# Patient Record
Sex: Male | Born: 1970
Health system: Southern US, Community
[De-identification: ages and names within clinical notes are randomized; demographics above are authoritative.]

## PROBLEM LIST (undated history)

## (undated) DIAGNOSIS — E119 Type 2 diabetes mellitus without complications: Secondary | ICD-10-CM

## (undated) DIAGNOSIS — H409 Unspecified glaucoma: Secondary | ICD-10-CM

## (undated) DIAGNOSIS — K219 Gastro-esophageal reflux disease without esophagitis: Secondary | ICD-10-CM

## (undated) DIAGNOSIS — M549 Dorsalgia, unspecified: Secondary | ICD-10-CM

## (undated) DIAGNOSIS — I10 Essential (primary) hypertension: Secondary | ICD-10-CM

## (undated) HISTORY — PX: CATARACT EXTRACTION: SUR2

## (undated) HISTORY — PX: GLAUCOMA SURGERY: SHX656

---

## 2019-05-10 ENCOUNTER — Emergency Department (HOSPITAL_BASED_OUTPATIENT_CLINIC_OR_DEPARTMENT_OTHER)
Admission: EM | Admit: 2019-05-10 | Discharge: 2019-05-10 | Disposition: A | Discharge status: Home / Self Care | Payer: Medicare HMO | Attending: Emergency Medicine | Admitting: Emergency Medicine

## 2019-05-10 ENCOUNTER — Other Ambulatory Visit: Payer: Self-pay

## 2019-05-10 ENCOUNTER — Emergency Department (HOSPITAL_BASED_OUTPATIENT_CLINIC_OR_DEPARTMENT_OTHER): Payer: Medicare HMO

## 2019-05-10 ENCOUNTER — Encounter (HOSPITAL_BASED_OUTPATIENT_CLINIC_OR_DEPARTMENT_OTHER): Payer: Self-pay

## 2019-05-10 DIAGNOSIS — E119 Type 2 diabetes mellitus without complications: Secondary | ICD-10-CM | POA: Diagnosis not present

## 2019-05-10 DIAGNOSIS — R1013 Epigastric pain: Secondary | ICD-10-CM | POA: Insufficient documentation

## 2019-05-10 DIAGNOSIS — R0789 Other chest pain: Secondary | ICD-10-CM | POA: Diagnosis not present

## 2019-05-10 DIAGNOSIS — R079 Chest pain, unspecified: Secondary | ICD-10-CM | POA: Diagnosis present

## 2019-05-10 DIAGNOSIS — Z87891 Personal history of nicotine dependence: Secondary | ICD-10-CM | POA: Insufficient documentation

## 2019-05-10 DIAGNOSIS — I10 Essential (primary) hypertension: Secondary | ICD-10-CM | POA: Diagnosis not present

## 2019-05-10 HISTORY — DX: Essential (primary) hypertension: I10

## 2019-05-10 HISTORY — DX: Unspecified glaucoma: H40.9

## 2019-05-10 HISTORY — DX: Type 2 diabetes mellitus without complications: E11.9

## 2019-05-10 HISTORY — DX: Dorsalgia, unspecified: M54.9

## 2019-05-10 HISTORY — DX: Gastro-esophageal reflux disease without esophagitis: K21.9

## 2019-05-10 LAB — CBC WITH DIFFERENTIAL/PLATELET
Abs Immature Granulocytes: 0.04 10*3/uL (ref 0.00–0.07)
Basophils Absolute: 0 10*3/uL (ref 0.0–0.1)
Basophils Relative: 1 %
Eosinophils Absolute: 0.2 10*3/uL (ref 0.0–0.5)
Eosinophils Relative: 2 %
HCT: 44.2 % (ref 39.0–52.0)
Hemoglobin: 15.2 g/dL (ref 13.0–17.0)
Immature Granulocytes: 1 %
Lymphocytes Relative: 41 %
Lymphs Abs: 3.5 10*3/uL (ref 0.7–4.0)
MCH: 30 pg (ref 26.0–34.0)
MCHC: 34.4 g/dL (ref 30.0–36.0)
MCV: 87.2 fL (ref 80.0–100.0)
Monocytes Absolute: 0.7 10*3/uL (ref 0.1–1.0)
Monocytes Relative: 8 %
Neutro Abs: 4.2 10*3/uL (ref 1.7–7.7)
Neutrophils Relative %: 47 %
Platelets: 314 10*3/uL (ref 150–400)
RBC: 5.07 MIL/uL (ref 4.22–5.81)
RDW: 12.1 % (ref 11.5–15.5)
WBC: 8.6 10*3/uL (ref 4.0–10.5)
nRBC: 0 % (ref 0.0–0.2)

## 2019-05-10 LAB — TROPONIN I (HIGH SENSITIVITY)
Troponin I (High Sensitivity): 6 ng/L (ref <18)
Troponin I (High Sensitivity): 5 ng/L (ref <18)

## 2019-05-10 LAB — URINALYSIS, ROUTINE W REFLEX MICROSCOPIC
Bilirubin Urine: NEGATIVE
Glucose, UA: >=500 mg/dL — AB
Hgb urine dipstick: NEGATIVE
Ketones, ur: NEGATIVE mg/dL
Leukocytes,Ua: NEGATIVE
Nitrite: NEGATIVE
Protein, ur: NEGATIVE mg/dL
Specific Gravity, Urine: 1.02 (ref 1.005–1.030)
pH: 6 (ref 5.0–8.0)

## 2019-05-10 LAB — URINALYSIS, MICROSCOPIC (REFLEX)

## 2019-05-10 LAB — COMPREHENSIVE METABOLIC PANEL
ALT: 46 U/L — ABNORMAL HIGH (ref 0–44)
AST: 48 U/L — ABNORMAL HIGH (ref 15–41)
Albumin: 3.5 g/dL (ref 3.5–5.0)
Alkaline Phosphatase: 46 U/L (ref 38–126)
Anion gap: 10 (ref 5–15)
BUN: 16 mg/dL (ref 6–20)
CO2: 24 mmol/L (ref 22–32)
Calcium: 8.9 mg/dL (ref 8.9–10.3)
Chloride: 99 mmol/L (ref 98–111)
Creatinine, Ser: 1.22 mg/dL (ref 0.61–1.24)
GFR calc Af Amer: >60 mL/min (ref >60)
GFR calc non Af Amer: >60 mL/min (ref >60)
Glucose, Bld: 152 mg/dL — ABNORMAL HIGH (ref 70–99)
Potassium: 3.2 mmol/L — ABNORMAL LOW (ref 3.5–5.1)
Sodium: 133 mmol/L — ABNORMAL LOW (ref 135–145)
Total Bilirubin: 1.2 mg/dL (ref 0.3–1.2)
Total Protein: 7.2 g/dL (ref 6.5–8.1)

## 2019-05-10 LAB — LIPASE, BLOOD: Lipase: 37 U/L (ref 11–51)

## 2019-05-10 MED ORDER — PANTOPRAZOLE SODIUM 40 MG IV SOLR
40.0000 mg | Freq: Once | INTRAVENOUS | Status: AC
  Administered 2019-05-10: 40 mg via INTRAVENOUS
  Filled 2019-05-10: qty 40

## 2019-05-10 MED ORDER — LIDOCAINE VISCOUS HCL 2 % MT SOLN
15.0000 mL | Freq: Once | OROMUCOSAL | Status: DC
  Filled 2019-05-10: qty 15

## 2019-05-10 MED ORDER — OMEPRAZOLE 20 MG PO CPDR: 20.0000 mg | DELAYED_RELEASE_CAPSULE | Freq: Two times a day (BID) | ORAL | 0 refills | Status: AC

## 2019-05-10 MED ORDER — ALUM & MAG HYDROXIDE-SIMETH 200-200-20 MG/5ML PO SUSP
30.0000 mL | Freq: Once | ORAL | Status: DC
  Filled 2019-05-10: qty 30

## 2019-05-10 MED ORDER — POTASSIUM CHLORIDE CRYS ER 20 MEQ PO TBCR
40.0000 meq | EXTENDED_RELEASE_TABLET | Freq: Once | ORAL | Status: AC
  Administered 2019-05-10: 40 meq via ORAL
  Filled 2019-05-10: qty 2

## 2019-05-10 NOTE — Discharge Instructions (Addendum)
Your laboratory results were within normal limits today.  I have prescribed a short course of medication to help with your likely reflux, this can also be purchased over the counter with the generic brand.   Follow up with your primary care physician in 1 week for improvement.

## 2019-05-10 NOTE — ED Triage Notes (Addendum)
Pt c/o CP x 3 days-denies at present-also c/o abd pain that is worse after eating-NAD-steady gait

## 2019-05-10 NOTE — ED Provider Notes (Signed)
[facility] EMERGENCY DEPARTMENT Provider Note   CSN: [id number] Arrival date & time: 05/10/19  1425     History Chief Complaint  Patient presents with  . Chest Pain    Andre Guerrero is a 49 y.o. male.  49 y.o male with a PMH of DM, GERD, HTN presents to the ED with a chief complaint of epigastric/chest pain x a couple of days ago. Symptoms began after eating at [employer], a [employer]. Reports a burning epigastric sensation with radiation to his right arm. He does have a prior history of reflux, which he was on omeprazole for but discontinued taking. The pain is worse after eating and better with rest. However, he had the burning sensation this morning upon waking up, reports he was "sweaty" when this occurred. He has not taken any medication for improvement in his symptoms. No fever, no nausea, no sob, no vomiting. No prior history of MI, CAD or prior blood clots. No family history of MI.   The history is provided by the patient.  Chest Pain Pain location:  Epigastric Pain quality: burning   Pain radiates to:  R arm Pain severity:  Mild Duration:  3 days Chronicity:  New Ineffective treatments:  None tried Associated symptoms: heartburn   Associated symptoms: no back pain, no fever, no nausea, no numbness, no shortness of breath and no vomiting        Past Medical History:  Diagnosis Date  . Back pain   . Diabetes mellitus without complication ([place])   . GERD (gastroesophageal reflux disease)   . Glaucoma   . Hypertension     There are no problems to display for this patient.   Past Surgical History:  Procedure Laterality Date  . CATARACT EXTRACTION    . GLAUCOMA SURGERY         No family history on file.  Social History   Tobacco Use  . Smoking status: Former Research scientist (life sciences)  . Smokeless tobacco: Never Used  Substance Use Topics  . Alcohol use: Never  . Drug use: Never    Home Medications Prior to Admission medications   Medication Sig  Start Date End Date Taking? Authorizing Provider  omeprazole (PRILOSEC) 20 MG capsule Take 1 capsule (20 mg total) by mouth 2 (two) times daily before a meal for 14 days. 05/10/19 05/24/19  Janeece Fitting, PA-C    Allergies    Patient has no known allergies.  Review of Systems   Review of Systems  Constitutional: Negative for fever.  HENT: Negative for sinus pressure and sore throat.   Respiratory: Negative for shortness of breath.   Cardiovascular: Positive for chest pain.  Gastrointestinal: Positive for heartburn. Negative for nausea and vomiting.  Genitourinary: Negative for flank pain.  Musculoskeletal: Negative for back pain.  Neurological: Negative for numbness.  All other systems reviewed and are negative.   Physical Exam Updated Vital Signs BP 109/68 (BP Location: Right Arm)   Pulse 79   Temp 98.6 F (37 C) (Oral)   Resp 20   Ht 6' (1.829 m)   Wt 129.7 kg   SpO2 96%   BMI 38.79 kg/m   Physical Exam Vitals and nursing note reviewed.  Constitutional:      Appearance: He is well-developed.  HENT:     Head: Normocephalic and atraumatic.  Cardiovascular:     Rate and Rhythm: Normal rate.     Heart sounds: No murmur.     Comments: No bilateral pitting edema, no calf tenderness.  Pulmonary:     Effort: Pulmonary effort is normal. No tachypnea.     Breath sounds: Normal breath sounds. No decreased breath sounds, wheezing or rhonchi.     Comments: No wheezing, rhonchi, rales. Abdominal:     General: Bowel sounds are normal.     Palpations: Abdomen is soft.     Tenderness: There is abdominal tenderness in the epigastric area. There is no right CVA tenderness or left CVA tenderness.     Hernia: No hernia is present.     Comments: Soft, nontender to palpation, bowel sounds are normal.  Neurological:     Mental Status: He is alert.     ED Results / Procedures / Treatments   Labs (all labs ordered are listed, but only abnormal results are displayed) Labs Reviewed    COMPREHENSIVE METABOLIC PANEL - Abnormal; Notable for the following components:      Result Value   Sodium 133 (*)    Potassium 3.2 (*)    Glucose, Bld 152 (*)    AST 48 (*)    ALT 46 (*)    All other components within normal limits  URINALYSIS, ROUTINE W REFLEX MICROSCOPIC - Abnormal; Notable for the following components:   Glucose, UA >=500 (*)    All other components within normal limits  URINALYSIS, MICROSCOPIC (REFLEX) - Abnormal; Notable for the following components:   Bacteria, UA RARE (*)    All other components within normal limits  CBC WITH DIFFERENTIAL/PLATELET  LIPASE, BLOOD  TROPONIN I (HIGH SENSITIVITY)  TROPONIN I (HIGH SENSITIVITY)    EKG None  Radiology DG Chest Portable 1 View  Result Date: 05/10/2019 CLINICAL DATA:  Pt c/o CP x 3 days--also c/o abd pain that is worse after eating-NAD- PER ED Notes. HX: HTN, Diabetes, former smoker EXAM: PORTABLE CHEST 1 VIEW COMPARISON:  05/02/2018 FINDINGS: Cardiac silhouette normal in size. Normal mediastinal and hilar contours. Mild stable linear atelectasis at the right lung base. Lungs otherwise clear. Eventrated posterior right hemidiaphragm, stable. No pleural effusion or pneumothorax. Skeletal structures are grossly intact. IMPRESSION: No active disease. Electronically Signed   By: Amie Portland M.D.   On: 05/10/2019 15:39    Procedures Procedures (including critical care time)  Medications Ordered in ED Medications  alum & mag hydroxide-simeth (MAALOX/MYLANTA) 200-200-20 MG/5ML suspension 30 mL (30 mLs Oral Refused 05/10/19 1526)    And  lidocaine (XYLOCAINE) 2 % viscous mouth solution 15 mL (15 mLs Oral Refused 05/10/19 1526)  pantoprazole (PROTONIX) injection 40 mg (40 mg Intravenous Given 05/10/19 1540)  potassium chloride SA (KLOR-CON) CR tablet 40 mEq (40 mEq Oral Given 05/10/19 1652)    ED Course  I have reviewed the triage vital signs and the nursing notes.  Pertinent labs & imaging results that were  available during my care of the patient were reviewed by me and considered in my medical decision making (see chart for details).  Clinical Course as of May 10 1751  Wed May 10, 2019  1649 Two episodes of non bloody diarrhea yesterday, given oral k+ for replacement.   Potassium(!): 3.2 [JS]  1752 Remains flat  Troponin I (High Sensitivity): 5 [JS]    Clinical Course User Index [JS] Claude Manges, PA-C   MDM Rules/Calculators/A&P    Patient with a PMH of DM, presents to the ED with chief complaint of epigastric burning pain with radiation to his chest along with the right side of her chest for the past 3 days.  Her symptoms began after  eating fried fissures, a local restaurant where he had some fried fish.  He reports an episode today where he woke up and felt the same symptoms, symptoms have been occurring after every meal.  This is relieved with not eating along with resting.  He does not have any prior history of MI, no family CAD, currently diabetic but reports compliance with his medication.  Differential diagnoses included but not limited to ACS, GERD, digestion.  He was previously on omeprazole for his reflux but discontinued this as he states "I just got better".  He has not taken any medication for improvement in his symptoms today.  I attempted to give him a GI cocktail however reports he does not like the taste, therefore this was not given.  He was given some Protonix with improvement in his symptoms.  Hypertension of his blood work revealed no leukocytosis, no signs of anemia.  CMP with some mild hyponatremia, mild hypokalemia which orally replaced for patient.  He did report to nonbloody episodes of diarrhea yesterday.  Creatinine level is within normal limits, no signs of dehydration.  LFTs are slightly elevated, suspect this is likely coming from irritation to his abdomen.  He has not had any nausea, vomiting, pain to the right upper quadrant, lower suspicion for gallbladder  pathology.  Lipase level is within normal limits.  First troponin is negative, will obtain delta.  EKG without any ST elevations, changes consistent with infarct.  UA with some rare bacteria but no nitrites or leukocytes or ketones.  Chest x-ray without any constipation, pneumothorax, pleural effusion.  He reports no fevers at home, no sick exposures.  HEART SCORE 2   Delta troponin remains flat.  Patient was advised to continue his omeprazole dosing may be up to 2 times a day before his meals.  All results were discussed with patient at length, return precautions provided.  Patient stable for discharge.  Portions of this note were generated with Scientist, clinical (histocompatibility and immunogenetics). Dictation errors may occur despite best attempts at proofreading.  Final Clinical Impression(s) / ED Diagnoses Final diagnoses:  Atypical chest pain    Rx / DC Orders ED Discharge Orders         Ordered    omeprazole (PRILOSEC) 20 MG capsule  2 times daily before meals     05/10/19 1713           Claude Manges, PA-C 05/10/19 1753    Virgina Norfolk, DO 05/10/19 1804

## 2019-09-26 ENCOUNTER — Ambulatory Visit: Payer: Self-pay

## 2019-09-26 ENCOUNTER — Ambulatory Visit: Payer: Medicare HMO | Attending: Internal Medicine

## 2019-09-26 DIAGNOSIS — Z23 Encounter for immunization: Secondary | ICD-10-CM

## 2019-09-26 NOTE — Progress Notes (Signed)
   Covid-19 Vaccination Clinic  Name:  Andre Guerrero    MRN: [id number] DOB: 1970-08-11  09/26/2019  [name] was observed post Covid-19 immunization for 15 minutes without incident. He was provided with Vaccine Information Sheet and instruction to access the V-Safe system.   [name] was instructed to call 911 with any severe reactions post vaccine: Marland Kitchen Difficulty breathing  . Swelling of face and throat  . A fast heartbeat  . A bad rash all over body  . Dizziness and weakness   Immunizations Administered    Name Date Dose VIS Date Route   Pfizer COVID-19 Vaccine 09/26/2019 12:02 PM 0.3 mL 04/12/2018 Intramuscular   Manufacturer: [employer], [employer]   Lot: [id number]   NDC: [address]-1

## 2019-10-17 ENCOUNTER — Ambulatory Visit: Payer: Medicare HMO | Attending: Internal Medicine

## 2019-10-17 DIAGNOSIS — Z23 Encounter for immunization: Secondary | ICD-10-CM

## 2019-10-17 NOTE — Progress Notes (Signed)
   Covid-19 Vaccination Clinic  Name:  Tayson Schnelle    MRN: [id number] DOB: 1970/07/31  10/17/2019  Mr. Pedro was observed post Covid-19 immunization for 15 minutes without incident. He was provided with Vaccine Information Sheet and instruction to access the V-Safe system.   Mr. Mckeehan was instructed to call 911 with any severe reactions post vaccine: Marland Kitchen Difficulty breathing  . Swelling of face and throat  . A fast heartbeat  . A bad rash all over body  . Dizziness and weakness   Immunizations Administered    Name Date Dose VIS Date Route   Pfizer COVID-19 Vaccine 10/17/2019 12:17 PM 0.3 mL 04/12/2018 Intramuscular   Manufacturer: [employer], [employer]   Lot: [id number]   NDC: [address]-2

## 2021-06-16 IMAGING — DX DG CHEST 1V PORT
1 series · 1 of 1 positions shown · non-contrast
Comparison: 05/02/2018

CLINICAL DATA: Pt c/o CP x 3 days--also c/o abd pain that is worse
after eating-NAD- PER ED Notes. HX: HTN, Diabetes, former smoker

EXAM:
PORTABLE CHEST 1 VIEW

[chest ap]
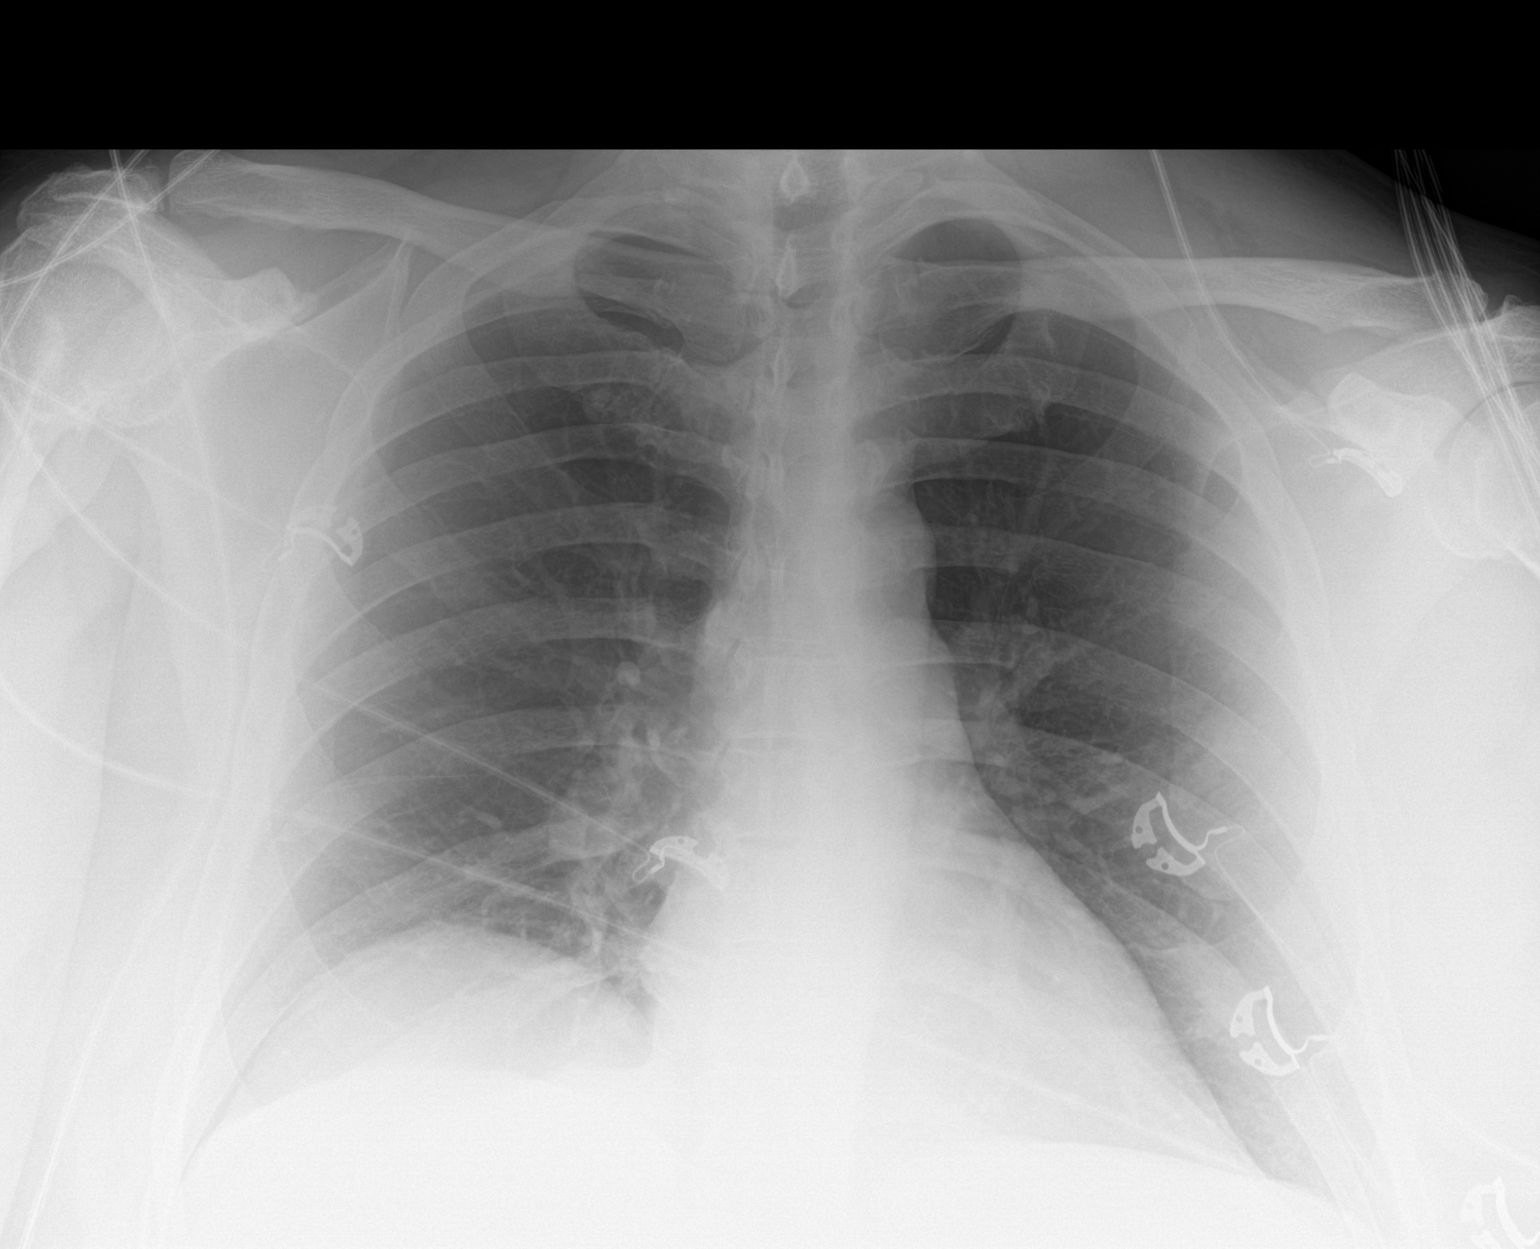

[1 of 1 positions shown; findings below may reference images not displayed]

FINDINGS: Cardiac silhouette normal in size. Normal mediastinal and hilar
contours.

Mild stable linear atelectasis at the right lung base. Lungs
otherwise clear.

Eventrated posterior right hemidiaphragm, stable. No pleural
effusion or pneumothorax.

Skeletal structures are grossly intact.
IMPRESSION: No active disease.

## 2022-04-05 ENCOUNTER — Other Ambulatory Visit: Payer: Self-pay

## 2022-04-05 ENCOUNTER — Emergency Department (HOSPITAL_BASED_OUTPATIENT_CLINIC_OR_DEPARTMENT_OTHER): Payer: 59

## 2022-04-05 ENCOUNTER — Emergency Department (HOSPITAL_BASED_OUTPATIENT_CLINIC_OR_DEPARTMENT_OTHER)
Admission: EM | Admit: 2022-04-05 | Discharge: 2022-04-06 | Disposition: A | Discharge status: Home / Self Care | Payer: 59 | Attending: Emergency Medicine | Admitting: Emergency Medicine

## 2022-04-05 ENCOUNTER — Encounter (HOSPITAL_BASED_OUTPATIENT_CLINIC_OR_DEPARTMENT_OTHER): Payer: Self-pay | Admitting: Emergency Medicine

## 2022-04-05 DIAGNOSIS — R0789 Other chest pain: Secondary | ICD-10-CM | POA: Diagnosis not present

## 2022-04-05 DIAGNOSIS — M545 Low back pain, unspecified: Secondary | ICD-10-CM | POA: Diagnosis not present

## 2022-04-05 DIAGNOSIS — R1030 Lower abdominal pain, unspecified: Secondary | ICD-10-CM | POA: Diagnosis present

## 2022-04-05 LAB — BASIC METABOLIC PANEL
Anion gap: 7 (ref 5–15)
BUN: 15 mg/dL (ref 6–20)
CO2: 25 mmol/L (ref 22–32)
Calcium: 8.7 mg/dL — ABNORMAL LOW (ref 8.9–10.3)
Chloride: 101 mmol/L (ref 98–111)
Creatinine, Ser: 1.14 mg/dL (ref 0.61–1.24)
GFR, Estimated: >60 mL/min (ref >60)
Glucose, Bld: 129 mg/dL — ABNORMAL HIGH (ref 70–99)
Potassium: 3.4 mmol/L — ABNORMAL LOW (ref 3.5–5.1)
Sodium: 133 mmol/L — ABNORMAL LOW (ref 135–145)

## 2022-04-05 LAB — URINALYSIS, ROUTINE W REFLEX MICROSCOPIC
Bilirubin Urine: NEGATIVE
Glucose, UA: >=500 mg/dL — AB
Hgb urine dipstick: NEGATIVE
Ketones, ur: NEGATIVE mg/dL
Leukocytes,Ua: NEGATIVE
Nitrite: NEGATIVE
Protein, ur: NEGATIVE mg/dL
Specific Gravity, Urine: 1.015 (ref 1.005–1.030)
pH: 6 (ref 5.0–8.0)

## 2022-04-05 LAB — CBC
HCT: 41.9 % (ref 39.0–52.0)
Hemoglobin: 14.2 g/dL (ref 13.0–17.0)
MCH: 30.3 pg (ref 26.0–34.0)
MCHC: 33.9 g/dL (ref 30.0–36.0)
MCV: 89.3 fL (ref 80.0–100.0)
Platelets: 312 10*3/uL (ref 150–400)
RBC: 4.69 MIL/uL (ref 4.22–5.81)
RDW: 12.3 % (ref 11.5–15.5)
WBC: 8.6 10*3/uL (ref 4.0–10.5)
nRBC: 0 % (ref 0.0–0.2)

## 2022-04-05 LAB — TROPONIN I (HIGH SENSITIVITY): Troponin I (High Sensitivity): 6 ng/L (ref <18)

## 2022-04-05 LAB — URINALYSIS, MICROSCOPIC (REFLEX)

## 2022-04-05 LAB — LIPASE, BLOOD: Lipase: 43 U/L (ref 11–51)

## 2022-04-05 MED ORDER — OXYCODONE-ACETAMINOPHEN 5-325 MG PO TABS
2.0000 | ORAL_TABLET | Freq: Once | ORAL | Status: AC
  Administered 2022-04-05: 2 via ORAL
  Filled 2022-04-05: qty 2

## 2022-04-05 NOTE — ED Provider Notes (Incomplete)
[facility] Provider Note   CSN: [id number] Arrival date & time: 04/05/22  2135     History {Add pertinent medical, surgical, social history, OB history to HPI:1} Chief Complaint  Patient presents with  . Flank Pain    Shayla Donnel is a 52 y.o. male.  Pt complains of right sided low back pain.  Pt reports pain in right low back radiates to right side of lower abdomen. Pt reports he also had an episode of discomfort in his chest.  Pt reports chest discomfort was brief  The history is provided by the patient. No language interpreter was used.  Flank Pain This is a new problem.       Home Medications Prior to Admission medications   Medication Sig Start Date End Date Taking? Authorizing Provider  omeprazole (PRILOSEC) 20 MG capsule Take 1 capsule (20 mg total) by mouth 2 (two) times daily before a meal for 14 days. 05/10/19 05/24/19  Janeece Fitting, PA-C      Allergies    Benadryl [diphenhydramine]    Review of Systems   Review of Systems  Genitourinary:  Positive for flank pain.  All other systems reviewed and are negative.   Physical Exam Updated Vital Signs BP (!) 152/79 (BP Location: Left Arm)   Pulse 71   Temp 98.1 F (36.7 C) (Oral)   Resp 18   SpO2 97%  Physical Exam Vitals and nursing note reviewed.  Constitutional:      Appearance: He is well-developed.  HENT:     Head: Normocephalic.     Nose: Nose normal.     Mouth/Throat:     Mouth: Mucous membranes are moist.  Cardiovascular:     Rate and Rhythm: Normal rate.  Pulmonary:     Effort: Pulmonary effort is normal.  Abdominal:     General: Abdomen is flat. There is no distension.     Palpations: Abdomen is soft.  Musculoskeletal:        General: Normal range of motion.     Cervical back: Normal range of motion.  Skin:    General: Skin is warm.  Neurological:     Mental Status: He is alert and oriented to person, place, and time.  Psychiatric:         Mood and Affect: Mood normal.     ED Results / Procedures / Treatments   Labs (all labs ordered are listed, but only abnormal results are displayed) Labs Reviewed  BASIC METABOLIC PANEL - Abnormal; Notable for the following components:      Result Value   Sodium 133 (*)    Potassium 3.4 (*)    Glucose, Bld 129 (*)    Calcium 8.7 (*)    All other components within normal limits  URINALYSIS, ROUTINE W REFLEX MICROSCOPIC - Abnormal; Notable for the following components:   Glucose, UA >=500 (*)    All other components within normal limits  URINALYSIS, MICROSCOPIC (REFLEX) - Abnormal; Notable for the following components:   Bacteria, UA RARE (*)    All other components within normal limits  CBC  LIPASE, BLOOD  TROPONIN I (HIGH SENSITIVITY)  TROPONIN I (HIGH SENSITIVITY)    EKG None  Radiology DG Chest 2 View  Result Date: 04/05/2022 CLINICAL DATA:  Right-sided chest pain radiating to back for several days EXAM: CHEST - 2 VIEW COMPARISON:  05/10/2019 FINDINGS: Frontal and lateral views of the chest demonstrate an unremarkable cardiac silhouette. No airspace disease, effusion,  or pneumothorax. There is a faint rounded 1.6 cm density overlying the right anterior third rib, which may reflect summation of shadows. Follow-up two-view chest x-ray may be useful when clinically able to exclude underlying parenchymal nodule. No acute bony abnormalities. IMPRESSION: 1. 1.6 cm rounded density projecting over the right anterior third rib. This most likely reflect superimposition of shadows, and PA and lateral chest x-ray recommended when clinical situation permits. 2. Otherwise no acute intrathoracic process. Electronically Signed   By: Randa Ngo M.D.   On: 04/05/2022 22:09    Procedures Procedures  {Document cardiac monitor, telemetry assessment procedure when appropriate:1}  Medications Ordered in ED Medications  oxyCODONE-acetaminophen (PERCOCET/ROXICET) 5-325 MG per tablet 2  tablet (2 tablets Oral Given 04/05/22 2330)    ED Course/ Medical Decision Making/ A&P   {   Click here for ABCD2, HEART and other calculatorsREFRESH Note before signing :1}                          Medical Decision Making Pt reports pain in his right lower back.  Pt reports pain radiates to right lower abdomen   Amount and/or Complexity of Data Reviewed Independent Historian: spouse    Details: Pt here with family supportive  Labs: ordered. Decision-making details documented in ED Course.    Details: Labs ordered reviewed and interpreted  Ua show glucose over 500  Radiology: ordered and independent interpretation performed. Decision-making details documented in ED Course.    Details: Ct renal  no stone.  No acute abnormality   ECG/medicine tests: ordered and independent interpretation performed. Decision-making details documented in ED Course.  Risk Prescription drug management.   ***  {Document critical care time when appropriate:1} {Document review of labs and clinical decision tools ie heart score, Chads2Vasc2 etc:1}  {Document your independent review of radiology images, and any outside records:1} {Document your discussion with family members, caretakers, and with consultants:1} {Document social determinants of health affecting pt's care:1} {Document your decision making why or why not admission, treatments were needed:1} Final Clinical Impression(s) / ED Diagnoses Final diagnoses:  None    Rx / DC Orders ED Discharge Orders     None

## 2022-04-05 NOTE — ED Triage Notes (Signed)
Patient here with right flank pain, states it is his lower flank, tender to the touch, from mid abdomen to his back.  He also states that he has a tightness in his chest.

## 2022-04-05 NOTE — ED Provider Notes (Signed)
[facility] Provider Note   CSN: [id number] Arrival date & time: 04/05/22  2135     History  Chief Complaint  Patient presents with   Flank Pain    Andre Guerrero is a 52 y.o. male.  Pt complains of right sided low back pain.  Pt reports pain in right low back radiates to right side of lower abdomen. Pt reports he also had an episode of discomfort in his chest.  Pt reports chest discomfort was brief  The history is provided by the patient. No language interpreter was used.  Flank Pain This is a new problem.       Home Medications Prior to Admission medications   Medication Sig Start Date End Date Taking? Authorizing Provider  omeprazole (PRILOSEC) 20 MG capsule Take 1 capsule (20 mg total) by mouth 2 (two) times daily before a meal for 14 days. 05/10/19 05/24/19  Janeece Fitting, PA-C      Allergies    Benadryl [diphenhydramine]    Review of Systems   Review of Systems  Genitourinary:  Positive for flank pain.  All other systems reviewed and are negative.   Physical Exam Updated Vital Signs BP (!) 152/79 (BP Location: Left Arm)   Pulse 71   Temp 98.1 F (36.7 C) (Oral)   Resp 18   SpO2 97%  Physical Exam Vitals and nursing note reviewed.  Constitutional:      Appearance: He is well-developed.  HENT:     Head: Normocephalic.     Nose: Nose normal.     Mouth/Throat:     Mouth: Mucous membranes are moist.  Cardiovascular:     Rate and Rhythm: Normal rate.  Pulmonary:     Effort: Pulmonary effort is normal.  Abdominal:     General: Abdomen is flat. There is no distension.     Palpations: Abdomen is soft.  Musculoskeletal:        General: Normal range of motion.     Cervical back: Normal range of motion.  Skin:    General: Skin is warm.  Neurological:     Mental Status: He is alert and oriented to person, place, and time.  Psychiatric:        Mood and Affect: Mood normal.     ED Results / Procedures /  Treatments   Labs (all labs ordered are listed, but only abnormal results are displayed) Labs Reviewed  BASIC METABOLIC PANEL - Abnormal; Notable for the following components:      Result Value   Sodium 133 (*)    Potassium 3.4 (*)    Glucose, Bld 129 (*)    Calcium 8.7 (*)    All other components within normal limits  URINALYSIS, ROUTINE W REFLEX MICROSCOPIC - Abnormal; Notable for the following components:   Glucose, UA >=500 (*)    All other components within normal limits  URINALYSIS, MICROSCOPIC (REFLEX) - Abnormal; Notable for the following components:   Bacteria, UA RARE (*)    All other components within normal limits  CBC  LIPASE, BLOOD  TROPONIN I (HIGH SENSITIVITY)  TROPONIN I (HIGH SENSITIVITY)    EKG None  Radiology DG Chest 2 View  Result Date: 04/05/2022 CLINICAL DATA:  Right-sided chest pain radiating to back for several days EXAM: CHEST - 2 VIEW COMPARISON:  05/10/2019 FINDINGS: Frontal and lateral views of the chest demonstrate an unremarkable cardiac silhouette. No airspace disease, effusion, or pneumothorax. There is a faint rounded 1.6 cm  density overlying the right anterior third rib, which may reflect summation of shadows. Follow-up two-view chest x-ray may be useful when clinically able to exclude underlying parenchymal nodule. No acute bony abnormalities. IMPRESSION: 1. 1.6 cm rounded density projecting over the right anterior third rib. This most likely reflect superimposition of shadows, and PA and lateral chest x-ray recommended when clinical situation permits. 2. Otherwise no acute intrathoracic process. Electronically Signed   By: Randa Ngo M.D.   On: 04/05/2022 22:09    Procedures Procedures    Medications Ordered in ED Medications  oxyCODONE-acetaminophen (PERCOCET/ROXICET) 5-325 MG per tablet 2 tablet (2 tablets Oral Given 04/05/22 2330)    ED Course/ Medical Decision Making/ A&P                             Medical Decision Making Pt  reports pain in his right lower back.  Pt reports pain radiates to right lower abdomen   Amount and/or Complexity of Data Reviewed Independent Historian: spouse    Details: Pt here with family supportive  Labs: ordered. Decision-making details documented in ED Course.    Details: Labs ordered reviewed and interpreted  Ua show glucose over 500  Radiology: ordered and independent interpretation performed. Decision-making details documented in ED Course.    Details: Ct renal  no stone.  No acute abnormality   ECG/medicine tests: ordered and independent interpretation performed. Decision-making details documented in ED Course.    Details: EKG  no acute   Risk Prescription drug management. Risk Details: Pt reports pain improved with percocet.   Pt's care turned over to Dr. Florina Ou.   Plan discharge to follow up with primary care if second troponin is negative   Pt has pain medication,  canceled rx for hydrocodone          Final Clinical Impression(s) / ED Diagnoses Final diagnoses:  Acute right-sided low back pain without sciatica    Rx / DC Orders ED Discharge Orders          Ordered    HYDROcodone-acetaminophen (NORCO/VICODIN) 5-325 MG tablet  Every 4 hours PRN,   Status:  Discontinued        04/06/22 0010          An After Visit Summary was printed and given to the patient.     Fransico Meadow, PA-C 04/06/22 0015    Drenda Freeze, MD 04/08/22 [phone]

## 2022-04-06 ENCOUNTER — Other Ambulatory Visit (HOSPITAL_BASED_OUTPATIENT_CLINIC_OR_DEPARTMENT_OTHER): Payer: Self-pay

## 2022-04-06 LAB — TROPONIN I (HIGH SENSITIVITY): Troponin I (High Sensitivity): 6 ng/L (ref <18)

## 2022-04-06 MED ORDER — HYDROCODONE-ACETAMINOPHEN 5-325 MG PO TABS
1.0000 | ORAL_TABLET | ORAL | 0 refills | Status: DC | PRN
  Filled 2022-04-06: qty 20, 4d supply, fill #0

## 2022-12-09 ENCOUNTER — Other Ambulatory Visit: Payer: Self-pay

## 2022-12-09 ENCOUNTER — Encounter (HOSPITAL_BASED_OUTPATIENT_CLINIC_OR_DEPARTMENT_OTHER): Payer: Self-pay

## 2022-12-09 ENCOUNTER — Emergency Department (HOSPITAL_BASED_OUTPATIENT_CLINIC_OR_DEPARTMENT_OTHER): Payer: Medicare HMO

## 2022-12-09 ENCOUNTER — Emergency Department (HOSPITAL_BASED_OUTPATIENT_CLINIC_OR_DEPARTMENT_OTHER)
Admission: EM | Admit: 2022-12-09 | Discharge: 2022-12-10 | Disposition: A | Discharge status: Home / Self Care | Payer: Medicare HMO | Attending: Emergency Medicine | Admitting: Emergency Medicine

## 2022-12-09 DIAGNOSIS — R0789 Other chest pain: Secondary | ICD-10-CM | POA: Insufficient documentation

## 2022-12-09 DIAGNOSIS — R791 Abnormal coagulation profile: Secondary | ICD-10-CM | POA: Diagnosis not present

## 2022-12-09 DIAGNOSIS — E119 Type 2 diabetes mellitus without complications: Secondary | ICD-10-CM | POA: Diagnosis not present

## 2022-12-09 DIAGNOSIS — R059 Cough, unspecified: Secondary | ICD-10-CM | POA: Insufficient documentation

## 2022-12-09 DIAGNOSIS — Z20822 Contact with and (suspected) exposure to covid-19: Secondary | ICD-10-CM | POA: Diagnosis not present

## 2022-12-09 DIAGNOSIS — R52 Pain, unspecified: Secondary | ICD-10-CM

## 2022-12-09 LAB — BASIC METABOLIC PANEL
Anion gap: 8 (ref 5–15)
BUN: 14 mg/dL (ref 6–20)
CO2: 26 mmol/L (ref 22–32)
Calcium: 8.6 mg/dL — ABNORMAL LOW (ref 8.9–10.3)
Chloride: 104 mmol/L (ref 98–111)
Creatinine, Ser: 1.14 mg/dL (ref 0.61–1.24)
GFR, Estimated: >60 mL/min (ref >60)
Glucose, Bld: 163 mg/dL — ABNORMAL HIGH (ref 70–99)
Potassium: 3.9 mmol/L (ref 3.5–5.1)
Sodium: 138 mmol/L (ref 135–145)

## 2022-12-09 LAB — TROPONIN I (HIGH SENSITIVITY): Troponin I (High Sensitivity): 4 ng/L (ref <18)

## 2022-12-09 LAB — CBC
HCT: 46.3 % (ref 39.0–52.0)
Hemoglobin: 15.9 g/dL (ref 13.0–17.0)
MCH: 30.3 pg (ref 26.0–34.0)
MCHC: 34.3 g/dL (ref 30.0–36.0)
MCV: 88.2 fL (ref 80.0–100.0)
Platelets: 371 10*3/uL (ref 150–400)
RBC: 5.25 MIL/uL (ref 4.22–5.81)
RDW: 12 % (ref 11.5–15.5)
WBC: 8.1 10*3/uL (ref 4.0–10.5)
nRBC: 0 % (ref 0.0–0.2)

## 2022-12-09 NOTE — ED Notes (Signed)
Patient transported to X-ray 

## 2022-12-09 NOTE — ED Triage Notes (Signed)
Pt reports chest tightness and soreness associated with shortness of breath, lightheadedness, cold sweats and lack of energy; onset a week ago. Denies cardiac history.

## 2022-12-09 NOTE — ED Provider Notes (Signed)
[facility] Provider Note   CSN: [id number] Arrival date & time: 12/09/22  2119     History {Add pertinent medical, surgical, social history, OB history to HPI:1} Chief Complaint  Patient presents with   Chest Pain    Andre Guerrero is a 52 y.o. male.  The history is provided by the patient and medical records.  Chest Pain Andre Guerrero is a 52 y.o. male who presents to the Emergency Department complaining of *** Sore all over, weakness for one week Has cold sweats but no fever twice No cough. Has sob.  Had bilateral shoulder, left side pain.  Had chest pain earlier - tight and sticking. Has chest pain for years.  No dysuria. No leg swelling.  No sick contacts  Hx/o DM, glaucoma.       Home Medications Prior to Admission medications   Medication Sig Start Date End Date Taking? Authorizing Provider  omeprazole (PRILOSEC) 20 MG capsule Take 1 capsule (20 mg total) by mouth 2 (two) times daily before a meal for 14 days. 05/10/19 05/24/19  Claude Manges, PA-C      Allergies    Benadryl [diphenhydramine]    Review of Systems   Review of Systems  Cardiovascular:  Positive for chest pain.    Physical Exam Updated Vital Signs BP 106/75 (BP Location: Right Arm)   Pulse (!) 102   Temp 97.7 F (36.5 C)   Resp 20   Ht 6' (1.829 m)   Wt 119.7 kg   SpO2 97%   BMI 35.80 kg/m  Physical Exam  ED Results / Procedures / Treatments   Labs (all labs ordered are listed, but only abnormal results are displayed) Labs Reviewed  BASIC METABOLIC PANEL - Abnormal; Notable for the following components:      Result Value   Glucose, Bld 163 (*)    Calcium 8.6 (*)    All other components within normal limits  CBC  TROPONIN I (HIGH SENSITIVITY)  TROPONIN I (HIGH SENSITIVITY)    EKG EKG Interpretation Date/Time:  Wednesday December 09 2022 21:25:51 EDT Ventricular Rate:  97 PR Interval:  179 QRS Duration:  94 QT  Interval:  330 QTC Calculation: 420 R Axis:   73  Text Interpretation: Sinus rhythm Probable inferior infarct, age indeterminate Confirmed by Alona Bene ([id number]) on 12/09/2022 9:29:25 PM  Radiology DG Chest 2 View  Result Date: 12/09/2022 CLINICAL DATA:  Chest pain. EXAM: CHEST - 2 VIEW COMPARISON:  04/05/2022 FINDINGS: The cardiomediastinal contours are normal. Chronic eventration of posterior right hemidiaphragm. Mild right lung base scarring. Pulmonary vasculature is normal. No consolidation, pleural effusion, or pneumothorax. No acute osseous abnormalities are seen. IMPRESSION: 1. No acute chest findings. 2. 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 Electronically Signed   By: Narda Rutherford M.D.   On: 12/09/2022 22:51    Procedures Procedures  {Document cardiac monitor, telemetry assessment procedure when appropriate:1}  Medications Ordered in ED Medications - No data to display  ED Course/ Medical Decision Making/ A&P   {   Click here for ABCD2, HEART and other calculatorsREFRESH Note before signing :1}                              Medical Decision Making Amount and/or Complexity of Data Reviewed Labs: ordered. Radiology: ordered.   ***  {Document critical care time when appropriate:1} {Document review of labs  and clinical decision tools ie heart score, Chads2Vasc2 etc:1}  {Document your independent review of radiology images, and any outside records:1} {Document your discussion with family members, caretakers, and with consultants:1} {Document social determinants of health affecting pt's care:1} {Document your decision making why or why not admission, treatments were needed:1} Final Clinical Impression(s) / ED Diagnoses Final diagnoses:  None    Rx / DC Orders ED Discharge Orders     None

## 2022-12-10 ENCOUNTER — Other Ambulatory Visit: Payer: Self-pay

## 2022-12-10 ENCOUNTER — Emergency Department (HOSPITAL_BASED_OUTPATIENT_CLINIC_OR_DEPARTMENT_OTHER): Payer: Medicare HMO

## 2022-12-10 DIAGNOSIS — R0789 Other chest pain: Secondary | ICD-10-CM | POA: Diagnosis not present

## 2022-12-10 LAB — HEPATIC FUNCTION PANEL
ALT: 29 U/L (ref 0–44)
AST: 28 U/L (ref 15–41)
Albumin: 3.1 g/dL — ABNORMAL LOW (ref 3.5–5.0)
Alkaline Phosphatase: 38 U/L (ref 38–126)
Bilirubin, Direct: 0.1 mg/dL (ref 0.0–0.2)
Indirect Bilirubin: 0.6 mg/dL (ref 0.3–0.9)
Total Bilirubin: 0.7 mg/dL (ref 0.3–1.2)
Total Protein: 6.3 g/dL — ABNORMAL LOW (ref 6.5–8.1)

## 2022-12-10 LAB — URINALYSIS, W/ REFLEX TO CULTURE (INFECTION SUSPECTED)
Bilirubin Urine: NEGATIVE
Glucose, UA: >=500 mg/dL — AB
Hgb urine dipstick: NEGATIVE
Ketones, ur: 15 mg/dL — AB
Leukocytes,Ua: NEGATIVE
Nitrite: NEGATIVE
Protein, ur: NEGATIVE mg/dL
Specific Gravity, Urine: >=1.03 (ref 1.005–1.030)
pH: 5.5 (ref 5.0–8.0)

## 2022-12-10 LAB — D-DIMER, QUANTITATIVE: D-Dimer, Quant: 0.51 ug{FEU}/mL — ABNORMAL HIGH (ref 0.00–0.50)

## 2022-12-10 LAB — TROPONIN I (HIGH SENSITIVITY): Troponin I (High Sensitivity): 4 ng/L (ref <18)

## 2022-12-10 LAB — SARS CORONAVIRUS 2 BY RT PCR: SARS Coronavirus 2 by RT PCR: NEGATIVE

## 2022-12-10 MED ORDER — KETOROLAC TROMETHAMINE 30 MG/ML IJ SOLN
15.0000 mg | Freq: Once | INTRAMUSCULAR | Status: AC
Start: 2022-12-10 — End: 2022-12-10
  Administered 2022-12-10: 15 mg via INTRAVENOUS
  Filled 2022-12-10: qty 1

## 2022-12-10 MED ORDER — IOHEXOL 350 MG/ML SOLN
75.0000 mL | Freq: Once | INTRAVENOUS | Status: AC | PRN
  Administered 2022-12-10: 75 mL via INTRAVENOUS

## 2022-12-10 NOTE — Discharge Instructions (Signed)
You had a CT scan performed in the Emergency Department today, which had some changes that can be seen with emphysema.
